# Patient Record
Sex: Female | Born: 2005 | Race: Black or African American | Hispanic: No | Marital: Single | Smoking: Never smoker
Health system: Southern US, Community
[De-identification: ages and names within clinical notes are randomized; demographics above are authoritative.]

## PROBLEM LIST (undated history)

## (undated) HISTORY — PX: CHOLECYSTECTOMY, LAPAROSCOPIC: SHX56

## (undated) HISTORY — PX: CHOLECYSTECTOMY: SHX55

---
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
## 2023-02-01 ENCOUNTER — Ambulatory Visit (LOCAL_COMMUNITY_HEALTH_CENTER): Payer: Medicaid Other | Admitting: Advanced Practice Midwife

## 2023-02-01 ENCOUNTER — Encounter: Payer: Self-pay | Admitting: Advanced Practice Midwife

## 2023-02-01 VITALS — BP 104/64 | Ht 62.5 in | Wt 137.4 lb

## 2023-02-01 DIAGNOSIS — Z309 Encounter for contraceptive management, unspecified: Secondary | ICD-10-CM

## 2023-02-01 DIAGNOSIS — Z3009 Encounter for other general counseling and advice on contraception: Secondary | ICD-10-CM

## 2023-02-01 DIAGNOSIS — Z30011 Encounter for initial prescription of contraceptive pills: Secondary | ICD-10-CM

## 2023-02-01 LAB — HM HIV SCREENING LAB: HM HIV Screening: NEGATIVE

## 2023-02-01 LAB — WET PREP FOR TRICH, YEAST, CLUE
Trichomonas Exam: NEGATIVE
Yeast Exam: NEGATIVE

## 2023-02-01 MED ORDER — NORGESTIM-ETH ESTRAD TRIPHASIC 0.18/0.215/0.25 MG-25 MCG PO TABS: 1.0000 | ORAL_TABLET | Freq: Every day | ORAL | 13 refills | Status: AC

## 2023-02-01 NOTE — Progress Notes (Signed)
Pt here for annual physical and birth control.  Wet mount results reviewed with patient.  No treatment needed at this time.  Pt counseled re starting OCPs today and how to label pill pack for Monday start.  Counseled to use condoms as backup x 7 days.  Pt verbalizes understanding. RX sent to pt's pharmacy for pickup.  Family planning packet given.  Condoms declined.-Collins Scotland, RN

## 2023-02-01 NOTE — Progress Notes (Signed)
[facility] [address] Main Number: [phone]    Family Planning Visit- Initial Visit  Subjective:  JESLYN NICKLOW is a 17 y.o. SBF [id number]   being seen today for an initial annual visit and to discuss reproductive life planning.  The patient is currently using No Method - Other Reason for pregnancy prevention. Patient reports   does not want a pregnancy in the next year.     report they are looking for a method that provides High efficacy at preventing pregnancy  Patient has the following medical conditions does not have a problem list on file.  Chief Complaint  Patient presents with   Annual Exam    Physical and birth control    Patient reports here for physical and birth control. LMP 01/14/23. Last sex 12/07/22 without condom; with current partner since 02/2022; 1 partner in last 3 mo. Onset coitus age 46 with 3 lifetime partners. Doesn't want a pregnancy. Interested in ocp's. Last PE 09/24/21. Senior at [employer] and works 8 hours/wk at [employer] Living with her m. Aunt and uncle.  Last dental exam 2023.   Patient denies drug use  Body mass index is 24.73 kg/m. - Patient is eligible for diabetes screening based on BMI> 25 and age >35?  not applicable HA1C ordered? not applicable  Patient reports 1  partner/s in last year. Desires STI screening?  Yes  Has patient been screened once for HCV in the past?  No  No results found for: "HCVAB"  Does the patient have current drug use (including MJ), have a partner with drug use, and/or has been incarcerated since last result? No  If yes-- Screen for HCV through [facility]   Does the patient meet criteria for HBV testing? No  Criteria:  -Household, sexual or needle sharing contact with HBV -History of drug use -HIV positive -Those with known Hep C   Health Maintenance Due  Topic Date Due   COVID-19 Vaccine (1) Never done   DTaP/Tdap/Td (1 - Tdap)  Never done   HPV VACCINES (1 - 2-dose series) Never done   CHLAMYDIA SCREENING  Never done   HIV Screening  Never done   Hepatitis C Screening  Never done    Review of Systems  All other systems reviewed and are negative.   The following portions of the patient's history were reviewed and updated as appropriate: allergies, current medications, past family history, past medical history, past social history, past surgical history and problem list. Problem list updated.   See flowsheet for other program required questions.  Objective:   Vitals:   02/01/23 1434  BP: 104/64  Weight: 137 lb 6.4 oz (62.3 kg)  Height: 5' 2.5" (1.588 m)    Physical Exam Constitutional:      Appearance: Normal appearance. She is normal weight.  HENT:     Head: Normocephalic and atraumatic.     Mouth/Throat:     Mouth: Mucous membranes are moist.  Eyes:     Conjunctiva/sclera: Conjunctivae normal.  Cardiovascular:     Rate and Rhythm: Normal rate and regular rhythm.  Pulmonary:     Effort: Pulmonary effort is normal.     Breath sounds: Normal breath sounds.  Abdominal:     General: Abdomen is flat.     Palpations: Abdomen is soft.     Comments: Soft without masses or tenderness, good tone  Genitourinary:    General: Normal vulva.  Exam position: Lithotomy position.     Vagina: Vaginal discharge (white creamy leukorrhea, ph<4.5) present.     Cervix: Normal.     Uterus: Normal.      Adnexa: Right adnexa normal and left adnexa normal.     Rectum: Normal.  Musculoskeletal:        General: Normal range of motion.     Cervical back: Normal range of motion and neck supple.  Skin:    General: Skin is warm and dry.  Neurological:     Mental Status: She is alert.  Psychiatric:        Mood and Affect: Mood normal.      Assessment and Plan:  HELAYNA DUN is a 17 y.o. female presenting to the [facility] Department for an initial annual wellness/contraceptive  visit  Contraception counseling: Reviewed options based on patient desire and reproductive life plan. Patient is interested in Oral Contraceptive. This was provided to the patient today.  if not why not clearly documented  Risks, benefits, and typical effectiveness rates were reviewed.  Questions were answered.  Written information was also given to the patient to review.    The patient will follow up in  1 years for surveillance.  The patient was told to call with any further questions, or with any concerns about this method of contraception.  Emphasized use of condoms 100% of the time for STI prevention.  Need for ECP was assessed. Patient reported last sex 12/07/22.  Reviewed options and patient desired No method of ECP, declined all    1. Family planning Treat wet mount per standing orders Immunization nurse consult  - WET PREP FOR TRICH, YEAST, CLUE - Syphilis Serology, [facility] - [facility] - Chlamydia/Gonorrhea [facility]  2. Encounter for initial prescription of contraceptive pills E-rx Tri Lo Sprintec #13 I po daily to begin today Please counsel pt on need for abstinance next 7 days     No follow-ups on file.  No future appointments.  Alberteen Spindle, CNM

## 2023-03-29 ENCOUNTER — Ambulatory Visit (LOCAL_COMMUNITY_HEALTH_CENTER): Payer: Medicaid Other

## 2023-03-29 DIAGNOSIS — Z23 Encounter for immunization: Secondary | ICD-10-CM

## 2023-03-29 DIAGNOSIS — Z719 Counseling, unspecified: Secondary | ICD-10-CM

## 2023-03-29 NOTE — Progress Notes (Signed)
Patient seen in nurse clinic for Meningo B vaccine.  VIS provided. Given IM left deltoid. Tolerated well. [facility] updated and 2 copies provided.  Discussed return dates for vaccine shots.  Discussed based on her insurance - she may want to return 04/26/2023, date indicated on [facility], prior to her 17th birthday.

## 2023-04-26 ENCOUNTER — Ambulatory Visit: Payer: Medicaid Other

## 2023-04-26 DIAGNOSIS — Z719 Counseling, unspecified: Secondary | ICD-10-CM

## 2023-04-26 DIAGNOSIS — Z23 Encounter for immunization: Secondary | ICD-10-CM | POA: Diagnosis not present

## 2023-04-26 NOTE — Progress Notes (Signed)
In nurse clinic for vaccines. Men B dose #2 given and tolerated well. Updated [facility] copy given and explained. Jerel Shepherd, RN

## 2023-08-27 ENCOUNTER — Ambulatory Visit: Payer: Medicaid Other

## 2024-06-05 ENCOUNTER — Other Ambulatory Visit: Payer: Self-pay | Admitting: Advanced Practice Midwife

## 2024-06-05 DIAGNOSIS — Z30011 Encounter for initial prescription of contraceptive pills: Secondary | ICD-10-CM
